# Patient Record
Sex: Male | Born: 2000
Health system: Southern US, Community
[De-identification: ages and names within clinical notes are randomized; demographics above are authoritative.]

## PROBLEM LIST (undated history)

## (undated) HISTORY — PX: TYMPANOSTOMY TUBE PLACEMENT: SHX32

## (undated) HISTORY — PX: CIRCUMCISION: SUR203

---
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
## 2022-08-18 ENCOUNTER — Encounter: Payer: Self-pay | Admitting: Family Medicine

## 2022-08-18 ENCOUNTER — Ambulatory Visit (INDEPENDENT_AMBULATORY_CARE_PROVIDER_SITE_OTHER): Payer: 59 | Admitting: Family Medicine

## 2022-08-18 ENCOUNTER — Other Ambulatory Visit (HOSPITAL_COMMUNITY): Payer: Self-pay

## 2022-08-18 VITALS — BP 148/88 | HR 108 | Ht 71.0 in | Wt 321.0 lb

## 2022-08-18 DIAGNOSIS — Z111 Encounter for screening for respiratory tuberculosis: Secondary | ICD-10-CM

## 2022-08-18 DIAGNOSIS — Z114 Encounter for screening for human immunodeficiency virus [HIV]: Secondary | ICD-10-CM

## 2022-08-18 DIAGNOSIS — Z1321 Encounter for screening for nutritional disorder: Secondary | ICD-10-CM

## 2022-08-18 DIAGNOSIS — Z6841 Body Mass Index (BMI) 40.0 and over, adult: Secondary | ICD-10-CM

## 2022-08-18 DIAGNOSIS — Z1329 Encounter for screening for other suspected endocrine disorder: Secondary | ICD-10-CM

## 2022-08-18 DIAGNOSIS — Z1159 Encounter for screening for other viral diseases: Secondary | ICD-10-CM

## 2022-08-18 DIAGNOSIS — F419 Anxiety disorder, unspecified: Secondary | ICD-10-CM | POA: Insufficient documentation

## 2022-08-18 DIAGNOSIS — Z131 Encounter for screening for diabetes mellitus: Secondary | ICD-10-CM

## 2022-08-18 DIAGNOSIS — E6609 Other obesity due to excess calories: Secondary | ICD-10-CM | POA: Insufficient documentation

## 2022-08-18 DIAGNOSIS — Z13220 Encounter for screening for lipoid disorders: Secondary | ICD-10-CM

## 2022-08-18 MED ORDER — HYDROXYZINE PAMOATE 25 MG PO CAPS
25.0000 mg | ORAL_CAPSULE | Freq: Three times a day (TID) | ORAL | 1 refills | Status: AC | PRN
Start: 2022-08-18 — End: ?
  Filled 2022-08-18 – 2022-08-19 (×2): qty 30, 10d supply, fill #0

## 2022-08-18 NOTE — Assessment & Plan Note (Signed)
Discussed lifestyle modifications follow diet low in saturated fat, reduce dietary salt intake, avoid fatty foods, maintain an exercise routine 3 to 5 days a week for a minimum total of 150 minutes.  Referral to P.R.EP

## 2022-08-18 NOTE — Addendum Note (Signed)
Addended by: DEL ORBE POLANCO, Hunner Garcon on: 08/18/2022 12:58 PM   Modules accepted: Level of Service  

## 2022-08-18 NOTE — Progress Notes (Signed)
New Patient Office Visit   Subjective   Patient ID: Ryan Mills, male    DOB: [birth year]  Age: 22 y.o. MRN: [id number]  CC:  Chief Complaint  Patient presents with   Establish Care    HPI Ryan Mills 22 year old male, presents to establish care. 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  Anxiety Presents for initial visit. Onset was 1 to 5 years ago. The problem has been waxing and waning. Symptoms include excessive worry, hyperventilation, nervous/anxious behavior, panic and shortness of breath. Patient reports no irritability or muscle tension. Symptoms occur occasionally. The symptoms are aggravated by social activities, family issues and work stress. Risk factors include prior traumatic experience and a major life event. His past medical history is significant for anxiety/panic attacks. There is no history of suicide attempts. Past treatments include lifestyle changes.     Outpatient Encounter Medications as of 08/18/2022  Medication Sig   hydrOXYzine (VISTARIL) 25 MG capsule Take 1 capsule (25 mg total) by mouth every 8 (eight) hours as needed.   albuterol (VENTOLIN HFA) 108 (90 Base) MCG/ACT inhaler Inhale 2 puffs into the lungs every 4 (four) hours as needed for wheezing or shortness of breath. (Patient not taking: Reported on 08/18/2022)   COVID-19 At Home Antigen Test (CARESTART COVID-19 HOME TEST) KIT Use as directed   EPINEPHrine 0.3 mg/0.3 mL IJ SOAJ injection Inject 0.3 mLs (0.3 mg total) into the muscle as needed for anaphylaxis. (Patient not taking: Reported on 08/18/2022)   No facility-administered encounter medications on file as of 08/18/2022.    Past Surgical History:  Procedure Laterality Date   ADENOIDECTOMY AND MYRINGOTOMY WITH TUBE PLACEMENT Bilateral 09/2005   [facility]   CIRCUMCISION     TYMPANOSTOMY TUBE PLACEMENT      Review of  Systems  Constitutional:  Negative for chills and fever.  Respiratory:  Negative for shortness of breath.   Cardiovascular:  Negative for chest pain.  Gastrointestinal:  Negative for heartburn and nausea.  Genitourinary:  Negative for dysuria.  Musculoskeletal:  Negative for myalgias.  Skin:  Negative for rash.  Neurological:  Negative for dizziness.  Psychiatric/Behavioral:  The patient is nervous/anxious.       Objective    BP (!) 148/88   Pulse (!) 108   Ht 5\' 11"  (1.803 m)   Wt (!) 321 lb (145.6 kg)   SpO2 98%   BMI 44.77 kg/m   Physical Exam Vitals reviewed.  Constitutional:      General: He is not in acute distress.    Appearance: Normal appearance. He is obese. He is not ill-appearing, toxic-appearing or diaphoretic.  Eyes:     General:        Right eye: No discharge.        Left eye: No discharge.     Conjunctiva/sclera: Conjunctivae normal.  Cardiovascular:     Rate and Rhythm: Normal rate.     Pulses: Normal pulses.     Heart sounds: Normal heart sounds.  Pulmonary:     Effort: Pulmonary effort is normal. No respiratory distress.     Breath sounds: Normal breath sounds.  Musculoskeletal:        General: Normal range of motion.     Cervical back: Normal range of motion.  Skin:    General: Skin is warm and dry.  Neurological:     General: No  focal deficit present.     Mental Status: He is alert and oriented to person, place, and time. Mental status is at baseline.  Psychiatric:        Mood and Affect: Mood normal.        Behavior: Behavior normal.       Assessment & Plan:  Anxiety Assessment & Plan:    08/18/2022   10:59 AM  GAD 7 : Generalized Anxiety Score  Nervous, Anxious, on Edge 2  Control/stop worrying 1  Worry too much - different things 2  Trouble relaxing 1  Restless 0  Easily annoyed or irritable 0  Afraid - awful might happen 1  Total GAD 7 Score 7  Anxiety Difficulty Somewhat difficult  Hydroxyzine 25 mg PRN Discussed about  cognitive behavioral therapy focusing on thoughts, belief, and attitudes that affects feelings and behavior, learning about coping skills to deal with certain problems. Maintaining a consistent routine and schedule, Practice stress management and self calming techniques, excersise regularly and spend time outdoors, Do not eat food that are high in fat, added sugar, or salt. Follow up in 8 weeks   Orders: -     hydrOXYzine Pamoate; Take 1 capsule (25 mg total) by mouth every 8 (eight) hours as needed.  Dispense: 30 capsule; Refill: 1  Screening for lipid disorders -     CBC with Differential/Platelet -     CMP14+EGFR -     Lipid panel  Screening for diabetes mellitus -     Hemoglobin A1c  Screening for thyroid disorder -     TSH + free T4  Encounter for vitamin deficiency screening -     VITAMIN D 25 Hydroxy (Vit-D Deficiency, Fractures)  Screening-pulmonary TB -     QuantiFERON-TB Gold Plus  Screening for HIV (human immunodeficiency virus) -     HIV Antibody (routine testing w rflx)  Need for hepatitis C screening test -     Hepatitis C antibody  Class 3 severe obesity due to excess calories with body mass index (BMI) of 40.0 to 44.9 in adult, unspecified whether serious comorbidity present Assessment & Plan: Discussed lifestyle modifications follow diet low in saturated fat, reduce dietary salt intake, avoid fatty foods, maintain an exercise routine 3 to 5 days a week for a minimum total of 150 minutes.  Referral to P.R.EP  Orders: -     Amb Referral To Provider Referral Exercise Program (P.R.E.P)    Return in about 8 weeks (around 10/13/2022), or blood pressure readings at home for 2 weeks if elevated make an appointment, for Anxiety.   Cruzita Lederer Newman Nip, FNP

## 2022-08-18 NOTE — Progress Notes (Deleted)
Complete physical exam  Patient: Ryan Mills   DOB: Jan 18, 2001   22 y.o. Male  MRN: [id number]  Subjective:    Chief Complaint  Patient presents with   Establish Care    Ryan Mills is a 22 y.o. male who presents today for a complete physical exam. He reports consuming a general diet. The patient does not participate in regular exercise at present. He generally feels well. He reports sleeping fairly well. He does have additional problems to discuss today.    Most recent fall risk assessment:    08/18/2022   10:58 AM  Fall Risk   Falls in the past year? 0  Number falls in past yr: 0  Injury with Fall? 0  Risk for fall due to : No Fall Risks  Follow up Falls evaluation completed     Most recent depression screenings:    08/18/2022   10:58 AM 05/09/2019   10:28 AM  PHQ 2/9 Scores  PHQ - 2 Score 3 0  PHQ- 9 Score 7 0    Vision:Within last year and Dental: No current dental problems and Receives regular dental care  Patient Care Team: Del Newman Nip, Tenna Child, FNP as PCP - General (Family Medicine)   Outpatient Medications Prior to Visit  Medication Sig   albuterol (VENTOLIN HFA) 108 (90 Base) MCG/ACT inhaler Inhale 2 puffs into the lungs every 4 (four) hours as needed for wheezing or shortness of breath. (Patient not taking: Reported on 08/18/2022)   COVID-19 At Home Antigen Test (CARESTART COVID-19 HOME TEST) KIT Use as directed   EPINEPHrine 0.3 mg/0.3 mL IJ SOAJ injection Inject 0.3 mLs (0.3 mg total) into the muscle as needed for anaphylaxis. (Patient not taking: Reported on 08/18/2022)   No facility-administered medications prior to visit.    Review of Systems  Constitutional:  Negative for chills and fever.  HENT:  Negative for ear pain and tinnitus.   Eyes:  Negative for blurred vision.  Respiratory:  Negative for cough and shortness of breath.   Cardiovascular:  Negative for chest pain.  Gastrointestinal:  Negative for abdominal pain, nausea and vomiting.   Genitourinary:  Negative for dysuria.  Musculoskeletal:  Negative for myalgias.  Skin:  Negative for rash.  Neurological:  Negative for dizziness and headaches.  Psychiatric/Behavioral:  Negative for suicidal ideas. The patient is not nervous/anxious.        Objective:    BP (!) 148/88   Pulse (!) 108   Ht 5\' 11"  (1.803 m)   Wt (!) 321 lb (145.6 kg)   SpO2 98%   BMI 44.77 kg/m  BP Readings from Last 3 Encounters:  08/18/22 (!) 148/88  05/09/19 (!) 130/83 (89 %, Z = 1.23 /  93 %, Z = 1.48)*   *BP percentiles are based on the 2017 AAP Clinical Practice Guideline for boys      Physical Exam Vitals reviewed.  Constitutional:      Appearance: Normal appearance.  HENT:     Head: Normocephalic.     Right Ear: Tympanic membrane and ear canal normal. There is no impacted cerumen.     Left Ear: Tympanic membrane and ear canal normal. There is no impacted cerumen.     Nose: Nose normal. No congestion.     Mouth/Throat:     Mouth: Mucous membranes are moist.     Pharynx: Oropharynx is clear. No oropharyngeal exudate.  Eyes:     Extraocular Movements: Extraocular movements intact.  Conjunctiva/sclera: Conjunctivae normal.  Cardiovascular:     Rate and Rhythm: Normal rate and regular rhythm.     Pulses: Normal pulses.     Heart sounds: Normal heart sounds.  Pulmonary:     Effort: Pulmonary effort is normal.     Breath sounds: Normal breath sounds.  Abdominal:     General: Bowel sounds are normal.     Palpations: Abdomen is soft. There is no mass.     Tenderness: There is no abdominal tenderness. There is no right CVA tenderness or left CVA tenderness.  Musculoskeletal:        General: Normal range of motion.     Cervical back: Normal range of motion and neck supple.     Right lower leg: No edema.     Left lower leg: No edema.  Skin:    General: Skin is warm and dry.     Capillary Refill: Capillary refill takes less than 2 seconds.     Findings: No lesion.   Neurological:     General: No focal deficit present.     Mental Status: He is alert.     Coordination: Coordination normal.     Gait: Gait normal.  Psychiatric:        Mood and Affect: Mood normal.        Behavior: Behavior normal.        Thought Content: Thought content normal.      No results found for any visits on 08/18/22.    Assessment & Plan:    Routine Health Maintenance and Physical Exam  Immunization History  Administered Date(s) Administered   Influenza,inj,Quad PF,6+ Mos 05/09/2019   Meningococcal B, OMV 05/09/2019, 06/08/2019   Meningococcal Mcv4o 05/09/2019    Health Maintenance  Topic Date Due   COVID-19 Vaccine (1) Never done   HPV VACCINES (1 - Male 2-dose series) Never done   HIV Screening  Never done   Hepatitis C Screening  Never done   DTaP/Tdap/Td (1 - Tdap) Never done   INFLUENZA VACCINE  12/09/2022    Discussed health benefits of physical activity, and encouraged him to engage in regular exercise appropriate for his age and condition.  There are no diagnoses linked to this encounter.  No follow-ups on file.     Cruzita Lederer Newman Nip, FNP

## 2022-08-18 NOTE — Patient Instructions (Signed)
It was pleasure meeting with you today. Please take medications as prescribed. Follow up with your primary health provider if any health concerns arises. If symptoms worsen please contact your primary care provider and/or visit the emergency department.  

## 2022-08-18 NOTE — Assessment & Plan Note (Signed)
    08/18/2022   10:59 AM  GAD 7 : Generalized Anxiety Score  Nervous, Anxious, on Edge 2  Control/stop worrying 1  Worry too much - different things 2  Trouble relaxing 1  Restless 0  Easily annoyed or irritable 0  Afraid - awful might happen 1  Total GAD 7 Score 7  Anxiety Difficulty Somewhat difficult  Hydroxyzine 25 mg PRN Discussed about cognitive behavioral therapy focusing on thoughts, belief, and attitudes that affects feelings and behavior, learning about coping skills to deal with certain problems. Maintaining a consistent routine and schedule, Practice stress management and self calming techniques, excersise regularly and spend time outdoors, Do not eat food that are high in fat, added sugar, or salt. Follow up in 8 weeks

## 2022-08-19 ENCOUNTER — Other Ambulatory Visit (HOSPITAL_COMMUNITY): Payer: Self-pay

## 2022-09-02 ENCOUNTER — Other Ambulatory Visit (HOSPITAL_COMMUNITY): Payer: Self-pay

## 2022-09-22 DIAGNOSIS — Z114 Encounter for screening for human immunodeficiency virus [HIV]: Secondary | ICD-10-CM | POA: Diagnosis not present

## 2022-09-22 DIAGNOSIS — Z131 Encounter for screening for diabetes mellitus: Secondary | ICD-10-CM | POA: Diagnosis not present

## 2022-09-22 DIAGNOSIS — Z1159 Encounter for screening for other viral diseases: Secondary | ICD-10-CM | POA: Diagnosis not present

## 2022-09-22 DIAGNOSIS — Z1321 Encounter for screening for nutritional disorder: Secondary | ICD-10-CM | POA: Diagnosis not present

## 2022-09-22 DIAGNOSIS — Z13220 Encounter for screening for lipoid disorders: Secondary | ICD-10-CM | POA: Diagnosis not present

## 2022-09-22 DIAGNOSIS — Z1329 Encounter for screening for other suspected endocrine disorder: Secondary | ICD-10-CM | POA: Diagnosis not present

## 2022-09-22 DIAGNOSIS — Z111 Encounter for screening for respiratory tuberculosis: Secondary | ICD-10-CM | POA: Diagnosis not present

## 2022-09-23 ENCOUNTER — Telehealth: Payer: Self-pay | Admitting: Family Medicine

## 2022-09-23 LAB — TSH+FREE T4: TSH: 3.68 u[IU]/mL (ref 0.450–4.500)

## 2022-09-23 LAB — QUANTIFERON-TB GOLD PLUS

## 2022-09-23 NOTE — Telephone Encounter (Signed)
Patient called and would like to pick up a copy of his blood work.  Call patient when ready to pick up. 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  Patient # [phone]

## 2022-09-23 NOTE — Telephone Encounter (Signed)
Spoke with patient. TB gold was ordered and drawn, still waiting on result. He said he will wait on that result to get all of his labs printed at once.

## 2022-09-24 LAB — QUANTIFERON-TB GOLD PLUS

## 2022-09-24 LAB — VITAMIN D 25 HYDROXY (VIT D DEFICIENCY, FRACTURES): Vit D, 25-Hydroxy: 10.8 ng/mL — ABNORMAL LOW (ref 30.0–100.0)

## 2022-09-26 LAB — CMP14+EGFR
ALT: 64 IU/L — ABNORMAL HIGH (ref 0–44)
AST: 29 IU/L (ref 0–40)
Albumin/Globulin Ratio: 2 (ref 1.2–2.2)
Albumin: 4.9 g/dL (ref 4.3–5.2)
Alkaline Phosphatase: 103 IU/L (ref 51–125)
BUN/Creatinine Ratio: 8 — ABNORMAL LOW (ref 9–20)
BUN: 7 mg/dL (ref 6–20)
Bilirubin Total: 0.9 mg/dL (ref 0.0–1.2)
CO2: 22 mmol/L (ref 20–29)
Calcium: 10 mg/dL (ref 8.7–10.2)
Chloride: 101 mmol/L (ref 96–106)
Creatinine, Ser: 0.83 mg/dL (ref 0.76–1.27)
Globulin, Total: 2.5 g/dL (ref 1.5–4.5)
Glucose: 93 mg/dL (ref 70–99)
Potassium: 4.2 mmol/L (ref 3.5–5.2)
Sodium: 141 mmol/L (ref 134–144)
Total Protein: 7.4 g/dL (ref 6.0–8.5)
eGFR: 129 mL/min/{1.73_m2} (ref >59)

## 2022-09-26 LAB — CBC WITH DIFFERENTIAL/PLATELET
Basophils Absolute: 0.1 10*3/uL (ref 0.0–0.2)
Basos: 1 %
EOS (ABSOLUTE): 0.1 10*3/uL (ref 0.0–0.4)
Eos: 1 %
Hematocrit: 49.2 % (ref 37.5–51.0)
Hemoglobin: 16.8 g/dL (ref 13.0–17.7)
Immature Grans (Abs): 0 10*3/uL (ref 0.0–0.1)
Immature Granulocytes: 0 %
Lymphocytes Absolute: 4 10*3/uL — ABNORMAL HIGH (ref 0.7–3.1)
Lymphs: 39 %
MCH: 31.9 pg (ref 26.6–33.0)
MCHC: 34.1 g/dL (ref 31.5–35.7)
MCV: 93 fL (ref 79–97)
Monocytes Absolute: 0.8 10*3/uL (ref 0.1–0.9)
Monocytes: 8 %
Neutrophils Absolute: 5.1 10*3/uL (ref 1.4–7.0)
Neutrophils: 51 %
Platelets: 314 10*3/uL (ref 150–450)
RBC: 5.27 x10E6/uL (ref 4.14–5.80)
RDW: 12.8 % (ref 11.6–15.4)
WBC: 10.2 10*3/uL (ref 3.4–10.8)

## 2022-09-26 LAB — HIV ANTIBODY (ROUTINE TESTING W REFLEX): HIV Screen 4th Generation wRfx: NONREACTIVE

## 2022-09-26 LAB — QUANTIFERON-TB GOLD PLUS
QuantiFERON Nil Value: 0.02 IU/mL
QuantiFERON TB1 Ag Value: 0.03 IU/mL
QuantiFERON TB2 Ag Value: 0.03 IU/mL

## 2022-09-26 LAB — HEMOGLOBIN A1C
Est. average glucose Bld gHb Est-mCnc: 97 mg/dL
Hgb A1c MFr Bld: 5 % (ref 4.8–5.6)

## 2022-09-26 LAB — TSH+FREE T4: Free T4: 1.29 ng/dL (ref 0.93–1.60)

## 2022-09-26 LAB — LIPID PANEL
Chol/HDL Ratio: 3.2 ratio (ref 0.0–5.0)
Cholesterol, Total: 145 mg/dL (ref 100–169)
HDL: 46 mg/dL (ref >39)
LDL Chol Calc (NIH): 82 mg/dL (ref 0–109)
Triglycerides: 88 mg/dL (ref 0–89)
VLDL Cholesterol Cal: 17 mg/dL (ref 5–40)

## 2022-09-26 LAB — HEPATITIS C ANTIBODY: Hep C Virus Ab: NONREACTIVE

## 2022-10-15 ENCOUNTER — Other Ambulatory Visit (HOSPITAL_COMMUNITY): Payer: Self-pay

## 2022-10-15 ENCOUNTER — Ambulatory Visit (INDEPENDENT_AMBULATORY_CARE_PROVIDER_SITE_OTHER): Payer: 59 | Admitting: Family Medicine

## 2022-10-15 ENCOUNTER — Encounter: Payer: Self-pay | Admitting: Family Medicine

## 2022-10-15 VITALS — BP 132/82 | HR 90 | Ht 70.0 in | Wt 316.0 lb

## 2022-10-15 DIAGNOSIS — R234 Changes in skin texture: Secondary | ICD-10-CM | POA: Diagnosis not present

## 2022-10-15 DIAGNOSIS — I10 Essential (primary) hypertension: Secondary | ICD-10-CM

## 2022-10-15 MED ORDER — AMLODIPINE BESYLATE 2.5 MG PO TABS
2.5000 mg | ORAL_TABLET | Freq: Every day | ORAL | 3 refills | Status: DC
Start: 2022-10-15 — End: 2022-12-10
  Filled 2022-10-15 – 2022-10-21 (×2): qty 30, 30d supply, fill #0
  Filled 2022-11-19: qty 30, 30d supply, fill #1

## 2022-10-15 NOTE — Assessment & Plan Note (Signed)
Vitals:   10/15/22 1310 10/15/22 1311  BP: (!) 138/91 132/82   At home blood pressure readings 142/88, 140/86, 141/90, 143/92 Started amlodipine 2.5 mg daily Explained non pharmacological interventions such as low salt, DASH diet discussed. Educated on stress reduction and physical activity minimum 150 minutes per week. Discussed signs and symptoms of major cardiovascular event and need to present to the ED. Follow up in 8 weeks or sooner if needed. Patient verbalizes understanding regarding plan of care and all questions answered.

## 2022-10-15 NOTE — Patient Instructions (Signed)
        Great to see you today.   - Please take medications as prescribed. - Follow up with your primary health provider if any health concerns arises. - If symptoms worsen please contact your primary care provider and/or visit the emergency department.  

## 2022-10-15 NOTE — Progress Notes (Signed)
Patient Office Visit   Subjective   Patient ID: Ryan Mills, male    DOB: Mar 29, 2001  Age: 22 y.o. MRN: [id number]  CC:  Chief Complaint  Patient presents with   Hypertension    Patient is here for f/u of HTN. No changes or concerns since last visit.     HPI HEWITT GARNER 22 year old male, presents to the clinic for HTN follow up. He  has a past medical history of Acne (02/2016), Allergy to bee sting (12/2012), Mild intermittent asthma without complication (02/2016), and Obesity (02/2016).For the details of today's visit, please refer to assessment and plan.   HPI    Outpatient Encounter Medications as of 10/15/2022  Medication Sig   amLODipine (NORVASC) 2.5 MG tablet Take 1 tablet (2.5 mg total) by mouth daily.   hydrOXYzine (VISTARIL) 25 MG capsule Take 1 capsule (25 mg total) by mouth every 8 (eight) hours as needed.   albuterol (VENTOLIN HFA) 108 (90 Base) MCG/ACT inhaler Inhale 2 puffs into the lungs every 4 (four) hours as needed for wheezing or shortness of breath. (Patient not taking: Reported on 08/18/2022)   COVID-19 At Home Antigen Test [facility] COVID-19 HOME TEST) KIT Use as directed (Patient not taking: Reported on 10/15/2022)   EPINEPHrine 0.3 mg/0.3 mL IJ SOAJ injection Inject 0.3 mLs (0.3 mg total) into the muscle as needed for anaphylaxis. (Patient not taking: Reported on 08/18/2022)   No facility-administered encounter medications on file as of 10/15/2022.    Past Surgical History:  Procedure Laterality Date   ADENOIDECTOMY AND MYRINGOTOMY WITH TUBE PLACEMENT Bilateral 09/2005   [facility]   CIRCUMCISION     TYMPANOSTOMY TUBE PLACEMENT      Review of Systems  Constitutional:  Negative for chills and fever.  Respiratory:  Negative for shortness of breath.   Cardiovascular:  Negative for chest pain.  Gastrointestinal:  Negative for abdominal pain.  Genitourinary:  Negative for dysuria.  Musculoskeletal:  Negative for myalgias.  Neurological:  Negative  for dizziness.      Objective    BP 132/82   Pulse 90   Ht 5\' 10"  (1.778 m)   Wt (!) 316 lb (143.3 kg)   SpO2 98%   BMI 45.34 kg/m   Physical Exam Vitals reviewed.  Constitutional:      General: He is not in acute distress.    Appearance: Normal appearance. He is not ill-appearing, toxic-appearing or diaphoretic.  HENT:     Head: Normocephalic.  Eyes:     General:        Right eye: No discharge.        Left eye: No discharge.     Conjunctiva/sclera: Conjunctivae normal.  Cardiovascular:     Rate and Rhythm: Normal rate.     Pulses: Normal pulses.     Heart sounds: Normal heart sounds.  Pulmonary:     Effort: Pulmonary effort is normal. No respiratory distress.     Breath sounds: Normal breath sounds.  Musculoskeletal:        General: Normal range of motion.     Cervical back: Normal range of motion.  Skin:    General: Skin is warm and dry.     Capillary Refill: Capillary refill takes less than 2 seconds.  Neurological:     General: No focal deficit present.     Mental Status: He is alert and oriented to person, place, and time.     Coordination: Coordination normal.  Gait: Gait normal.  Psychiatric:        Mood and Affect: Mood normal.       Assessment & Plan:  Primary hypertension Assessment & Plan: Vitals:   10/15/22 1310 10/15/22 1311  BP: (!) 138/91 132/82   At home blood pressure readings 142/88, 140/86, 141/90, 143/92 Started amlodipine 2.5 mg daily Explained non pharmacological interventions such as low salt, DASH diet discussed. Educated on stress reduction and physical activity minimum 150 minutes per week. Discussed signs and symptoms of major cardiovascular event and need to present to the ED. Follow up in 8 weeks or sooner if needed. Patient verbalizes understanding regarding plan of care and all questions answered.   Orders: -     amLODIPine Besylate; Take 1 tablet (2.5 mg total) by mouth daily.  Dispense: 30 tablet; Refill: 3  Skin  texture changes -     Ambulatory referral to Dermatology    Return in about 8 weeks (around 12/10/2022) for hypertension, re-check blood pressure.   Cruzita Lederer Newman Nip, FNP

## 2022-10-21 ENCOUNTER — Other Ambulatory Visit (HOSPITAL_COMMUNITY): Payer: Self-pay

## 2022-11-19 ENCOUNTER — Other Ambulatory Visit (HOSPITAL_COMMUNITY): Payer: Self-pay

## 2022-12-10 ENCOUNTER — Other Ambulatory Visit (HOSPITAL_COMMUNITY): Payer: Self-pay

## 2022-12-10 ENCOUNTER — Ambulatory Visit (INDEPENDENT_AMBULATORY_CARE_PROVIDER_SITE_OTHER): Payer: 59 | Admitting: Family Medicine

## 2022-12-10 ENCOUNTER — Encounter: Payer: Self-pay | Admitting: Family Medicine

## 2022-12-10 VITALS — BP 142/88 | HR 89 | Ht 70.0 in | Wt 311.1 lb

## 2022-12-10 DIAGNOSIS — L819 Disorder of pigmentation, unspecified: Secondary | ICD-10-CM

## 2022-12-10 DIAGNOSIS — I10 Essential (primary) hypertension: Secondary | ICD-10-CM | POA: Diagnosis not present

## 2022-12-10 MED ORDER — AMLODIPINE BESYLATE 5 MG PO TABS
5.0000 mg | ORAL_TABLET | Freq: Every day | ORAL | 1 refills | Status: DC
  Filled 2022-12-10 – 2022-12-24 (×2): qty 90, 90d supply, fill #0

## 2022-12-10 NOTE — Progress Notes (Signed)
Patient Office Visit   Subjective   Patient ID: Ryan Mills, male    DOB: 2000/05/12  Age: 22 y.o. MRN: [id number]  CC:  Chief Complaint  Patient presents with   Care Management    8 week f/u, following up today for htn.     HPI QUOC TOME 22 year old male, presents to the clinic for HTN follow up. He  has a past medical history of Acne (02/2016), Allergy to bee sting (12/2012), Mild intermittent asthma without complication (02/2016), and Obesity (02/2016).For the details of today's visit, please refer to assessment and plan.   HPI    Outpatient Encounter Medications as of 12/10/2022  Medication Sig   amLODipine (NORVASC) 5 MG tablet Take 1 tablet (5 mg total) by mouth daily.   EPINEPHrine 0.3 mg/0.3 mL IJ SOAJ injection Inject 0.3 mLs (0.3 mg total) into the muscle as needed for anaphylaxis.   hydrOXYzine (VISTARIL) 25 MG capsule Take 1 capsule (25 mg total) by mouth every 8 (eight) hours as needed.   [DISCONTINUED] amLODipine (NORVASC) 2.5 MG tablet Take 1 tablet (2.5 mg total) by mouth daily.   [DISCONTINUED] COVID-19 At Home Antigen Test ([facility] COVID-19 HOME TEST) KIT Use as directed   albuterol (VENTOLIN HFA) 108 (90 Base) MCG/ACT inhaler Inhale 2 puffs into the lungs every 4 (four) hours as needed for wheezing or shortness of breath. (Patient not taking: Reported on 12/10/2022)   No facility-administered encounter medications on file as of 12/10/2022.    Past Surgical History:  Procedure Laterality Date   ADENOIDECTOMY AND MYRINGOTOMY WITH TUBE PLACEMENT Bilateral 09/2005   [facility]   CIRCUMCISION     TYMPANOSTOMY TUBE PLACEMENT      Review of Systems  Constitutional:  Negative for fever.  Eyes:  Negative for blurred vision.  Respiratory:  Negative for shortness of breath.   Cardiovascular:  Negative for chest pain.  Neurological:  Negative for dizziness.      Objective    BP (!) 142/88 (BP Location: Left Arm)   Pulse 89   Ht 5\' 10"  (1.778 m)   Wt  (!) 311 lb 1.9 oz (141.1 kg)   SpO2 98%   BMI 44.64 kg/m   Physical Exam Vitals reviewed.  Constitutional:      General: He is not in acute distress.    Appearance: Normal appearance. He is not ill-appearing, toxic-appearing or diaphoretic.  HENT:     Head: Normocephalic.  Eyes:     General:        Right eye: No discharge.        Left eye: No discharge.     Conjunctiva/sclera: Conjunctivae normal.  Cardiovascular:     Rate and Rhythm: Normal rate.     Pulses: Normal pulses.     Heart sounds: Normal heart sounds.  Pulmonary:     Effort: Pulmonary effort is normal. No respiratory distress.     Breath sounds: Normal breath sounds.  Musculoskeletal:        General: Normal range of motion.     Cervical back: Normal range of motion.  Skin:    General: Skin is warm and dry.     Capillary Refill: Capillary refill takes less than 2 seconds.  Neurological:     General: No focal deficit present.     Mental Status: He is alert and oriented to person, place, and time.     Coordination: Coordination normal.     Gait: Gait normal.  Psychiatric:  Mood and Affect: Mood normal.        Behavior: Behavior normal.       Assessment & Plan:  Hyperpigmentation -     Ambulatory referral to Dermatology  Primary hypertension Assessment & Plan: Vitals:   12/10/22 1303 12/10/22 1309  BP: (!) 157/91 (!) 142/88  Increased Amlodipine 5 mg once daily Continued discussion on DASH diet, low sodium diet and maintain a exercise routine for 150 minutes per week.    Other orders -     amLODIPine Besylate; Take 1 tablet (5 mg total) by mouth daily.  Dispense: 90 tablet; Refill: 1    Return in about 3 months (around 03/12/2023), or if symptoms worsen or fail to improve, for hypertension.   Cruzita Lederer Newman Nip, FNP

## 2022-12-10 NOTE — Assessment & Plan Note (Signed)
Vitals:   12/10/22 1303 12/10/22 1309  BP: (!) 157/91 (!) 142/88  Increased Amlodipine 5 mg once daily Continued discussion on DASH diet, low sodium diet and maintain a exercise routine for 150 minutes per week.

## 2022-12-10 NOTE — Patient Instructions (Signed)

## 2022-12-20 ENCOUNTER — Other Ambulatory Visit (HOSPITAL_COMMUNITY): Payer: Self-pay

## 2022-12-24 ENCOUNTER — Other Ambulatory Visit (HOSPITAL_COMMUNITY): Payer: Self-pay

## 2022-12-24 ENCOUNTER — Other Ambulatory Visit: Payer: Self-pay

## 2023-03-13 NOTE — Progress Notes (Unsigned)
   Established Patient Office Visit   Subjective  Patient ID: Ryan Mills, male    DOB: [birth year]  Age: 22 y.o. MRN: [id number]  No chief complaint on file.   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  HPI  ROS    Objective:     There were no vitals taken for this visit. {Vitals History (Optional):23777}  Physical Exam   No results found for any visits on 03/14/23.  The ASCVD Risk score (Arnett DK, et al., 2019) failed to calculate for the following reasons:   The 2019 ASCVD risk score is only valid for ages 103 to 10    Assessment & Plan:  There are no diagnoses linked to this encounter.  No follow-ups on file.   Cruzita Lederer Newman Nip, FNP

## 2023-03-13 NOTE — Patient Instructions (Signed)

## 2023-03-14 ENCOUNTER — Other Ambulatory Visit (HOSPITAL_COMMUNITY): Payer: Self-pay

## 2023-03-14 ENCOUNTER — Encounter: Payer: Self-pay | Admitting: Family Medicine

## 2023-03-14 ENCOUNTER — Ambulatory Visit (INDEPENDENT_AMBULATORY_CARE_PROVIDER_SITE_OTHER): Payer: 59 | Admitting: Family Medicine

## 2023-03-14 VITALS — BP 130/78 | HR 103 | Ht 70.0 in | Wt 311.0 lb

## 2023-03-14 DIAGNOSIS — Z23 Encounter for immunization: Secondary | ICD-10-CM

## 2023-03-14 DIAGNOSIS — I10 Essential (primary) hypertension: Secondary | ICD-10-CM | POA: Diagnosis not present

## 2023-03-14 MED ORDER — AMLODIPINE BESYLATE 5 MG PO TABS
5.0000 mg | ORAL_TABLET | Freq: Every day | ORAL | 1 refills | Status: DC
  Filled 2023-03-14 – 2023-03-21 (×2): qty 90, 90d supply, fill #0
  Filled 2023-07-02: qty 90, 90d supply, fill #1

## 2023-03-14 NOTE — Assessment & Plan Note (Signed)
Amlodipine 5 mg once daily Discussed with  patient to monitor their blood pressure regularly and maintain a heart-healthy diet rich in fruits, vegetables, whole grains, and low-fat dairy, while reducing sodium intake to less than 2,300 mg per day. Regular physical activity, such as 30 minutes of moderate exercise most days of the week, will help lower blood pressure and improve overall cardiovascular health. Avoiding smoking, limiting alcohol consumption, and managing stress. Take  prescribed medication, & take it as directed and avoid skipping doses. Seek emergency care if your blood pressure is (over 180/100) or you experience chest pain, shortness of breath, or sudden vision changes.Patient verbalizes understanding regarding plan of care and all questions answered.

## 2023-03-21 ENCOUNTER — Other Ambulatory Visit (HOSPITAL_COMMUNITY): Payer: Self-pay

## 2023-07-03 ENCOUNTER — Other Ambulatory Visit (HOSPITAL_COMMUNITY): Payer: Self-pay

## 2023-07-25 ENCOUNTER — Encounter: Payer: Self-pay | Admitting: Dermatology

## 2023-07-25 ENCOUNTER — Ambulatory Visit (INDEPENDENT_AMBULATORY_CARE_PROVIDER_SITE_OTHER): Payer: 59 | Admitting: Dermatology

## 2023-07-25 DIAGNOSIS — L83 Acanthosis nigricans: Secondary | ICD-10-CM

## 2023-07-25 NOTE — Progress Notes (Signed)
   New Patient Visit   Subjective  Ryan Mills is a 23 y.o. male who presents for the following: Discoloration  Patient states he  has Discoloration located at the arms and posterior Neck that he  would like to have examined. Patient reports the areas have been there for 2 years. He reports the areas are not bothersome.He states that the areas have not spread. Patient reports he  has not previously been treated for these areas.  The following portions of the chart were reviewed this encounter and updated as appropriate: medications, allergies, medical history  Review of Systems:  No other skin or systemic complaints except as noted in HPI or Assessment and Plan.  Objective  Well appearing patient in no apparent distress; mood and affect are within normal limits.  A focused examination was performed of the following areas: B/L antecubital fossas and posterior neck  Relevant exam findings are noted in the Assessment and Plan.                 Assessment & Plan   Acanthosis Nigricans Exam: Thickened, velvety, and darkened patches of skin  - Assessment: Light brown velvety plaques on posterior neck and bilateral antecubital facies, consistent with classic acanthosis nigricans. Patient's hemoglobin A1c is 5.0, below the prediabetic threshold of 5.7. Acanthosis nigricans noted as a warning sign of insulin resistance and potential future blood sugar elevation. No family history of diabetes reported.   - Plan:    Referral to [facility] for comprehensive weight management    Topical treatment regimen:     - The Ordinary glycolic acid toner: Apply at night, twice weekly for one month, then every other night     - La Roche-Posay moisturizer with urea: Apply every morning     - Eucerin Radiant Tone serum with thiamidol: Apply morning and night, except on nights when using glycolic acid toner    Follow-up appointment in 6 months to assess  improvement    Photographic documentation for baseline comparison  Follow-up in 6 months for reassessment and evaluation of treatment efficacy.    No follow-ups on file.  Documentation: I have reviewed the above documentation for accuracy and completeness, and I agree with the above.  Stasia Cavalier, am acting as scribe for Langston Reusing, DO.  Langston Reusing, DO

## 2023-07-25 NOTE — Patient Instructions (Addendum)
 Hello Mr. Skalla,  Thank you for visiting my office today. I appreciate your commitment to improving your health and addressing your concerns about the dark patches on your skin.  Here is a summary of the key instructions and recommendations from our consultation:  - Referral: I recommend a referral to [place] for a comprehensive plan that may include medications like semaglutide.  - Skin Care Regimen:   - Glycolic Acid Toner by The Ordinary: Use at night, two nights a week for the next month, then every other night.   - Moisturizer with Urea by La Roche-Posay: Apply every morning.   - Radiant Tone Serum by Eucerin with Thiamidol: Apply every morning and night, except on nights when using the glycolic acid toner.  - Follow-Up: Schedule a follow-up appointment in six months to assess progress. If there is no improvement, we will reassess the treatment plan.  Please ensure consistency in applying these treatments and making the recommended lifestyle changes. I have provided pictures and notes for your reference to help you find these products over the counter. If you have any questions or issues, feel free to contact us via MyChart.  We look forward to seeing the positive changes in your next visit. If you have any questions or concerns before then, please do not hesitate to contact our office.  Warm regards,  Dr. Langston Reusing, Dermatology        Important Information   Due to recent changes in healthcare laws, you may see results of your pathology and/or laboratory studies on MyChart before the doctors have had a chance to review them. We understand that in some cases there may be results that are confusing or concerning to you. Please understand that not all results are received at the same time and often the doctors may need to interpret multiple results in order to provide you with the best plan of care or course of treatment. Therefore, we  ask that you please give [place] 2 business days to thoroughly review all your results before contacting the office for clarification. Should we see a critical lab result, you will be contacted sooner.     If You Need Anything After Your Visit   If you have any questions or concerns for your doctor, please call our main line at [phone]. If no one answers, please leave a voicemail as directed and we will return your call as soon as possible. Messages left after 4 pm will be answered the following business day.    You may also send [place] a message via MyChart. We typically respond to MyChart messages within 1-2 business days.  For prescription refills, please ask your pharmacy to contact our office. Our fax number is [phone].  If you have an urgent issue when the clinic is closed that cannot wait until the next business day, you can page your doctor at the number below.     Please note that while we do our best to be available for urgent issues outside of office hours, we are not available 24/7.    If you have an urgent issue and are unable to reach [place], you may choose to seek medical care at your doctor's office, retail clinic, urgent care center, or emergency room.   If you have a medical emergency, please immediately call 911 or go to the emergency department. In the event of inclement weather, please call our main line at [phone] for an update on the status  of any delays or closures.  Dermatology Medication Tips: Please keep the boxes that topical medications come in in order to help keep track of the instructions about where and how to use these. Pharmacies typically print the medication instructions only on the boxes and not directly on the medication tubes.   If your medication is too expensive, please contact our office at [phone] or send [place] a message through MyChart.    We are unable to tell what your co-pay for medications will be in advance as this is different depending  on your insurance coverage. However, we may be able to find a substitute medication at lower cost or fill out paperwork to get insurance to cover a needed medication.    If a prior authorization is required to get your medication covered by your insurance company, please allow [place] 1-2 business days to complete this process.   Drug prices often vary depending on where the prescription is filled and some pharmacies may offer cheaper prices.   The website www.goodrx.com contains coupons for medications through different pharmacies. The prices here do not account for what the cost may be with help from insurance (it may be cheaper with your insurance), but the website can give you the price if you did not use any insurance.  - You can print the associated coupon and take it with your prescription to the pharmacy.  - You may also stop by our office during regular business hours and pick up a GoodRx coupon card.  - If you need your prescription sent electronically to a different pharmacy, notify our office through [facility] or by phone at [phone]

## 2023-09-15 ENCOUNTER — Ambulatory Visit: Payer: 59 | Admitting: Family Medicine

## 2023-09-25 ENCOUNTER — Other Ambulatory Visit: Payer: Self-pay | Admitting: Family Medicine

## 2023-09-25 DIAGNOSIS — I10 Essential (primary) hypertension: Secondary | ICD-10-CM

## 2023-09-26 ENCOUNTER — Other Ambulatory Visit: Payer: Self-pay

## 2023-09-26 ENCOUNTER — Other Ambulatory Visit (HOSPITAL_COMMUNITY): Payer: Self-pay

## 2023-09-26 MED ORDER — AMLODIPINE BESYLATE 5 MG PO TABS
5.0000 mg | ORAL_TABLET | Freq: Every day | ORAL | 1 refills | Status: DC
  Filled 2023-09-26: qty 90, 90d supply, fill #0
  Filled 2024-01-15: qty 90, 90d supply, fill #1

## 2023-10-14 ENCOUNTER — Ambulatory Visit: Admitting: Family Medicine

## 2023-10-14 ENCOUNTER — Other Ambulatory Visit: Payer: Self-pay

## 2023-10-14 ENCOUNTER — Other Ambulatory Visit (HOSPITAL_COMMUNITY): Payer: Self-pay

## 2023-10-14 VITALS — BP 145/85 | HR 109 | Ht 70.0 in | Wt 300.0 lb

## 2023-10-14 DIAGNOSIS — E038 Other specified hypothyroidism: Secondary | ICD-10-CM | POA: Diagnosis not present

## 2023-10-14 DIAGNOSIS — E559 Vitamin D deficiency, unspecified: Secondary | ICD-10-CM | POA: Diagnosis not present

## 2023-10-14 DIAGNOSIS — I10 Essential (primary) hypertension: Secondary | ICD-10-CM

## 2023-10-14 MED ORDER — AMLODIPINE BESYLATE 2.5 MG PO TABS
2.5000 mg | ORAL_TABLET | Freq: Every day | ORAL | 2 refills | Status: DC
  Filled 2023-10-14 (×2): qty 30, 30d supply, fill #0
  Filled 2023-11-12: qty 30, 30d supply, fill #1
  Filled 2023-12-19: qty 30, 30d supply, fill #2

## 2023-10-14 NOTE — Progress Notes (Signed)
 Established Patient Office Visit   Subjective  Patient ID: Ryan Mills, male    DOB: 2000/06/25  Age: 23 y.o. MRN: [id number]  Chief Complaint  Patient presents with   Medical Management of Chronic Issues    6 month follow up    He  has a past medical history of Acne (02/2016), Allergy to bee sting (12/2012), Mild intermittent asthma without complication (02/2016), and Obesity (02/2016).  HPI Patient presents to the clinic for hypertension follow up. For the details of today's visit, please refer to assessment and plan.   Review of Systems  Constitutional:  Negative for chills and fever.  Respiratory:  Negative for shortness of breath.   Cardiovascular:  Negative for chest pain.  Neurological:  Negative for dizziness and headaches.      Objective:     BP (!) 145/85   Pulse (!) 109   Ht 5\' 10"  (1.778 m)   Wt 300 lb (136.1 kg)   SpO2 97%   BMI 43.05 kg/m  BP Readings from Last 3 Encounters:  10/14/23 (!) 145/85  03/14/23 130/78  12/10/22 (!) 142/88      Physical Exam Vitals reviewed.  Constitutional:      General: He is not in acute distress.    Appearance: Normal appearance. He is not ill-appearing, toxic-appearing or diaphoretic.  HENT:     Head: Normocephalic.  Eyes:     General:        Right eye: No discharge.        Left eye: No discharge.     Conjunctiva/sclera: Conjunctivae normal.  Cardiovascular:     Rate and Rhythm: Normal rate.     Pulses: Normal pulses.     Heart sounds: Normal heart sounds.  Pulmonary:     Effort: Pulmonary effort is normal. No respiratory distress.     Breath sounds: Normal breath sounds.  Skin:    General: Skin is warm and dry.     Capillary Refill: Capillary refill takes less than 2 seconds.  Neurological:     Mental Status: He is alert.     Coordination: Coordination normal.     Gait: Gait normal.  Psychiatric:        Mood and Affect: Mood normal.        Behavior: Behavior normal.      No results found for  any visits on 10/14/23.  The ASCVD Risk score (Arnett DK, et al., 2019) failed to calculate for the following reasons:   The 2019 ASCVD risk score is only valid for ages 66 to 75    Assessment & Plan:  Primary hypertension Assessment & Plan: Vitals:   10/14/23 1341 10/14/23 1342  BP: (!) 143/84 (!) 145/85  Elevated blood pressure noted. Continue Amlodipine  5 mg once daily and augment by adding an additional 2.5 mg for a total daily dose of 7.5 mg Follow up in 4   weeks with at home blood pressure to monitor trends [facility] ordered. Discussed with  patient to monitor their blood pressure regularly and maintain a heart-healthy diet rich in fruits, vegetables, whole grains, and low-fat dairy, while reducing sodium intake to less than 2,300 mg per day. Regular physical activity, such as 30 minutes of moderate exercise most days of the week, will help lower blood pressure and improve overall cardiovascular health. Avoiding smoking, limiting alcohol consumption, and managing stress. Take  prescribed medication, & take it as directed and avoid skipping doses. Seek emergency care if your blood  pressure is (over 180/100) or you experience chest pain, shortness of breath, or sudden vision changes.Patient verbalizes understanding regarding plan of care and all questions answered.   Orders: -     BMP8+eGFR -     CBC with Differential/Platelet -     Hemoglobin A1c  TSH (thyroid-stimulating hormone deficiency) -     TSH + free T4  Vitamin D  deficiency -     VITAMIN D  25 Hydroxy (Vit-D Deficiency, Fractures)  Other orders -     amLODIPine  Besylate; Take 1 tablet (2.5 mg total) by mouth daily.  Dispense: 30 tablet; Refill: 2    Return in about 6 months (around 04/14/2024), or if symptoms worsen or fail to improve, for hypertension.   Avelino Lek Amber Bail, FNP

## 2023-10-14 NOTE — Patient Instructions (Signed)

## 2023-10-14 NOTE — Assessment & Plan Note (Signed)
 Vitals:   10/14/23 1341 10/14/23 1342  BP: (!) 143/84 (!) 145/85  Elevated blood pressure noted. Continue Amlodipine  5 mg once daily and augment by adding an additional 2.5 mg for a total daily dose of 7.5 mg Follow up in 4   weeks with at home blood pressure to monitor trends VIA [facility] ordered. Discussed with  patient to monitor their blood pressure regularly and maintain a heart-healthy diet rich in fruits, vegetables, whole grains, and low-fat dairy, while reducing sodium intake to less than 2,300 mg per day. Regular physical activity, such as 30 minutes of moderate exercise most days of the week, will help lower blood pressure and improve overall cardiovascular health. Avoiding smoking, limiting alcohol consumption, and managing stress. Take  prescribed medication, & take it as directed and avoid skipping doses. Seek emergency care if your blood pressure is (over 180/100) or you experience chest pain, shortness of breath, or sudden vision changes.Patient verbalizes understanding regarding plan of care and all questions answered.

## 2023-10-16 LAB — BMP8+EGFR
BUN/Creatinine Ratio: 9 (ref 9–20)
BUN: 8 mg/dL (ref 6–20)
CO2: 21 mmol/L (ref 20–29)
Calcium: 9.8 mg/dL (ref 8.7–10.2)
Chloride: 102 mmol/L (ref 96–106)
Creatinine, Ser: 0.89 mg/dL (ref 0.76–1.27)
Glucose: 94 mg/dL (ref 70–99)
Potassium: 4.5 mmol/L (ref 3.5–5.2)
Sodium: 140 mmol/L (ref 134–144)
eGFR: 126 mL/min/{1.73_m2} (ref >59)

## 2023-10-16 LAB — CBC WITH DIFFERENTIAL/PLATELET
Basophils Absolute: 0.1 10*3/uL (ref 0.0–0.2)
Basos: 1 %
EOS (ABSOLUTE): 0.2 10*3/uL (ref 0.0–0.4)
Eos: 3 %
Hematocrit: 47.9 % (ref 37.5–51.0)
Hemoglobin: 16.4 g/dL (ref 13.0–17.7)
Immature Grans (Abs): 0 10*3/uL (ref 0.0–0.1)
Immature Granulocytes: 0 %
Lymphocytes Absolute: 2.8 10*3/uL (ref 0.7–3.1)
Lymphs: 35 %
MCH: 32.7 pg (ref 26.6–33.0)
MCHC: 34.2 g/dL (ref 31.5–35.7)
MCV: 95 fL (ref 79–97)
Monocytes Absolute: 0.6 10*3/uL (ref 0.1–0.9)
Monocytes: 8 %
Neutrophils Absolute: 4.1 10*3/uL (ref 1.4–7.0)
Neutrophils: 53 %
Platelets: 283 10*3/uL (ref 150–450)
RBC: 5.02 x10E6/uL (ref 4.14–5.80)
RDW: 12.8 % (ref 11.6–15.4)
WBC: 7.8 10*3/uL (ref 3.4–10.8)

## 2023-10-16 LAB — HEMOGLOBIN A1C
Est. average glucose Bld gHb Est-mCnc: 97 mg/dL
Hgb A1c MFr Bld: 5 % (ref 4.8–5.6)

## 2023-10-16 LAB — VITAMIN D 25 HYDROXY (VIT D DEFICIENCY, FRACTURES): Vit D, 25-Hydroxy: 31.7 ng/mL (ref 30.0–100.0)

## 2023-10-16 LAB — TSH+FREE T4
Free T4: 1.18 ng/dL (ref 0.82–1.77)
TSH: 1.57 u[IU]/mL (ref 0.450–4.500)

## 2023-10-18 ENCOUNTER — Ambulatory Visit: Payer: Self-pay | Admitting: Family Medicine

## 2023-11-14 ENCOUNTER — Other Ambulatory Visit (HOSPITAL_COMMUNITY): Payer: Self-pay

## 2023-12-19 ENCOUNTER — Other Ambulatory Visit (HOSPITAL_BASED_OUTPATIENT_CLINIC_OR_DEPARTMENT_OTHER): Payer: Self-pay

## 2024-01-15 ENCOUNTER — Other Ambulatory Visit: Payer: Self-pay | Admitting: Family Medicine

## 2024-01-16 ENCOUNTER — Other Ambulatory Visit: Payer: Self-pay

## 2024-01-16 ENCOUNTER — Other Ambulatory Visit (HOSPITAL_COMMUNITY): Payer: Self-pay

## 2024-01-16 MED ORDER — AMLODIPINE BESYLATE 2.5 MG PO TABS
2.5000 mg | ORAL_TABLET | Freq: Every day | ORAL | 2 refills | Status: DC
  Filled 2024-01-16: qty 30, 30d supply, fill #0
  Filled 2024-02-12: qty 30, 30d supply, fill #1
  Filled 2024-03-13: qty 30, 30d supply, fill #2

## 2024-01-24 ENCOUNTER — Ambulatory Visit: Admitting: Dermatology

## 2024-02-13 ENCOUNTER — Other Ambulatory Visit (HOSPITAL_COMMUNITY): Payer: Self-pay

## 2024-03-13 ENCOUNTER — Other Ambulatory Visit (HOSPITAL_COMMUNITY): Payer: Self-pay

## 2024-04-08 ENCOUNTER — Other Ambulatory Visit: Payer: Self-pay | Admitting: Family Medicine

## 2024-04-08 DIAGNOSIS — I10 Essential (primary) hypertension: Secondary | ICD-10-CM

## 2024-04-09 ENCOUNTER — Other Ambulatory Visit: Payer: Self-pay

## 2024-04-09 ENCOUNTER — Other Ambulatory Visit (HOSPITAL_BASED_OUTPATIENT_CLINIC_OR_DEPARTMENT_OTHER): Payer: Self-pay

## 2024-04-09 MED ORDER — AMLODIPINE BESYLATE 2.5 MG PO TABS
2.5000 mg | ORAL_TABLET | Freq: Every day | ORAL | 2 refills | Status: AC
  Filled 2024-04-09: qty 30, 30d supply, fill #0
  Filled 2024-05-28: qty 30, 30d supply, fill #1

## 2024-04-09 MED ORDER — AMLODIPINE BESYLATE 5 MG PO TABS
5.0000 mg | ORAL_TABLET | Freq: Every day | ORAL | 1 refills | Status: AC
  Filled 2024-04-09: qty 90, 90d supply, fill #0

## 2024-04-16 ENCOUNTER — Telehealth: Admitting: Family Medicine

## 2024-04-16 DIAGNOSIS — R7301 Impaired fasting glucose: Secondary | ICD-10-CM

## 2024-04-16 DIAGNOSIS — E569 Vitamin deficiency, unspecified: Secondary | ICD-10-CM

## 2024-04-16 DIAGNOSIS — E7849 Other hyperlipidemia: Secondary | ICD-10-CM

## 2024-04-16 DIAGNOSIS — E038 Other specified hypothyroidism: Secondary | ICD-10-CM | POA: Diagnosis not present

## 2024-04-16 NOTE — Progress Notes (Signed)
   Virtual Visit via Video Note  I connected with Ryan Mills on 04/16/24 at  1:40 PM EST by a video enabled telemedicine application and verified that I am speaking with the correct person using two identifiers.  Patient Location: Home Provider Location: Office/Clinic  I discussed the limitations, risks, security, and privacy concerns of performing an evaluation and management service by video and the availability of in person appointments. I also discussed with the patient that there may be a patient responsible charge related to this service. The patient expressed understanding and agreed to proceed.  Subjective: PCP: Terry Wilhelmena Lloyd Hilario, FNP  Chief Complaint  Patient presents with   Hypertension    Six month follow up    HPI The patient was seen today virtually for follow-up of his chronic conditions and reports no complaints or concerns. He notes adherence and compliance with his current treatment regimen.   ROS: Per HPI  Current Outpatient Medications:    albuterol  (VENTOLIN  HFA) 108 (90 Base) MCG/ACT inhaler, Inhale 2 puffs into the lungs every 4 (four) hours as needed for wheezing or shortness of breath. (Patient not taking: Reported on 12/10/2022), Disp: 13.4 g, Rfl: 0   amLODipine  (NORVASC ) 2.5 MG tablet, Take 1 tablet (2.5 mg total) by mouth daily., Disp: 30 tablet, Rfl: 2   amLODipine  (NORVASC ) 5 MG tablet, Take 1 tablet (5 mg total) by mouth daily., Disp: 90 tablet, Rfl: 1   EPINEPHrine  0.3 mg/0.3 mL IJ SOAJ injection, Inject 0.3 mLs (0.3 mg total) into the muscle as needed for anaphylaxis. (Patient not taking: Reported on 10/14/2023), Disp: 2 each, Rfl: 1   hydrOXYzine  (VISTARIL ) 25 MG capsule, Take 1 capsule (25 mg total) by mouth every 8 (eight) hours as needed. (Patient not taking: Reported on 10/14/2023), Disp: 30 capsule, Rfl: 1  Observations/Objective: There were no vitals filed for this visit. Physical Exam Patient is well-developed, well-nourished in no acute  distress.  Resting comfortably at home.  Head is normocephalic, atraumatic.  No labored breathing.  Speech is clear and coherent with logical content.  Patient is alert and oriented at baseline.   Assessment and Plan: Other hyperlipidemia -     Lipid panel -     CMP14+EGFR -     CBC with Differential/Platelet  IFG (impaired fasting glucose) -     Hemoglobin A1c  Vitamin deficiency -     VITAMIN D  25 Hydroxy (Vit-D Deficiency, Fractures)  TSH (thyroid -stimulating hormone deficiency) -     TSH + free T4  Encouraged to continue treatment regimen as is pending labs  Note: This chart has been completed using [employer] (consulting) software, and while attempts have been made to ensure accuracy, certain words and phrases may not be transcribed as intended.    Follow Up Instructions: Return in about 6 months (around 10/15/2024).   I discussed the assessment and treatment plan with the patient. The patient was provided an opportunity to ask questions, and all were answered. The patient agreed with the plan and demonstrated an understanding of the instructions.   The patient was advised to call back or seek an in-person evaluation if the symptoms worsen or if the condition fails to improve as anticipated.  The above assessment and management plan was discussed with the patient. The patient verbalized understanding of and has agreed to the management plan.   Ryan Mills  Z Bacchus, FNP

## 2024-04-17 DIAGNOSIS — E7849 Other hyperlipidemia: Secondary | ICD-10-CM | POA: Diagnosis not present

## 2024-04-17 DIAGNOSIS — E038 Other specified hypothyroidism: Secondary | ICD-10-CM | POA: Diagnosis not present

## 2024-04-17 DIAGNOSIS — R7301 Impaired fasting glucose: Secondary | ICD-10-CM | POA: Diagnosis not present

## 2024-04-17 DIAGNOSIS — E569 Vitamin deficiency, unspecified: Secondary | ICD-10-CM | POA: Diagnosis not present

## 2024-04-18 LAB — HEMOGLOBIN A1C
Est. average glucose Bld gHb Est-mCnc: 97 mg/dL
Hgb A1c MFr Bld: 5 % (ref 4.8–5.6)

## 2024-04-18 LAB — LIPID PANEL
Chol/HDL Ratio: 2.9 ratio (ref 0.0–5.0)
Cholesterol, Total: 152 mg/dL (ref 100–199)
HDL: 52 mg/dL (ref >39)
LDL Chol Calc (NIH): 84 mg/dL (ref 0–99)
Triglycerides: 86 mg/dL (ref 0–149)
VLDL Cholesterol Cal: 16 mg/dL (ref 5–40)

## 2024-04-18 LAB — CBC WITH DIFFERENTIAL/PLATELET
Basophils Absolute: 0.1 x10E3/uL (ref 0.0–0.2)
Basos: 1 %
EOS (ABSOLUTE): 0.2 x10E3/uL (ref 0.0–0.4)
Eos: 2 %
Hematocrit: 50.7 % (ref 37.5–51.0)
Hemoglobin: 17.2 g/dL (ref 13.0–17.7)
Immature Grans (Abs): 0 x10E3/uL (ref 0.0–0.1)
Immature Granulocytes: 0 %
Lymphocytes Absolute: 2.8 x10E3/uL (ref 0.7–3.1)
Lymphs: 36 %
MCH: 32.1 pg (ref 26.6–33.0)
MCHC: 33.9 g/dL (ref 31.5–35.7)
MCV: 95 fL (ref 79–97)
Monocytes Absolute: 0.5 x10E3/uL (ref 0.1–0.9)
Monocytes: 7 %
Neutrophils Absolute: 4.4 x10E3/uL (ref 1.4–7.0)
Neutrophils: 54 %
Platelets: 323 x10E3/uL (ref 150–450)
RBC: 5.35 x10E6/uL (ref 4.14–5.80)
RDW: 12.7 % (ref 11.6–15.4)
WBC: 8 x10E3/uL (ref 3.4–10.8)

## 2024-04-18 LAB — TSH+FREE T4
Free T4: 1.31 ng/dL (ref 0.82–1.77)
TSH: 1 u[IU]/mL (ref 0.450–4.500)

## 2024-04-18 LAB — CMP14+EGFR
ALT: 79 IU/L — ABNORMAL HIGH (ref 0–44)
AST: 39 IU/L (ref 0–40)
Albumin: 5.1 g/dL (ref 4.3–5.2)
Alkaline Phosphatase: 90 IU/L (ref 47–123)
BUN/Creatinine Ratio: 9 (ref 9–20)
BUN: 8 mg/dL (ref 6–20)
Bilirubin Total: 0.7 mg/dL (ref 0.0–1.2)
CO2: 25 mmol/L (ref 20–29)
Calcium: 10.3 mg/dL — ABNORMAL HIGH (ref 8.7–10.2)
Chloride: 101 mmol/L (ref 96–106)
Creatinine, Ser: 0.87 mg/dL (ref 0.76–1.27)
Globulin, Total: 2.8 g/dL (ref 1.5–4.5)
Glucose: 93 mg/dL (ref 70–99)
Potassium: 5 mmol/L (ref 3.5–5.2)
Sodium: 142 mmol/L (ref 134–144)
Total Protein: 7.9 g/dL (ref 6.0–8.5)
eGFR: 126 mL/min/1.73 (ref >59)

## 2024-04-18 LAB — VITAMIN D 25 HYDROXY (VIT D DEFICIENCY, FRACTURES): Vit D, 25-Hydroxy: 28.9 ng/mL — ABNORMAL LOW (ref 30.0–100.0)

## 2024-04-26 ENCOUNTER — Ambulatory Visit: Payer: Self-pay | Admitting: Family Medicine

## 2024-04-26 DIAGNOSIS — E559 Vitamin D deficiency, unspecified: Secondary | ICD-10-CM

## 2024-04-26 MED ORDER — VITAMIN D (ERGOCALCIFEROL) 1.25 MG (50000 UNIT) PO CAPS
50000.0000 [IU] | ORAL_CAPSULE | ORAL | 1 refills | Status: AC
  Filled 2024-04-26: qty 27, 189d supply, fill #0

## 2024-05-09 ENCOUNTER — Other Ambulatory Visit (HOSPITAL_COMMUNITY): Payer: Self-pay

## 2024-05-28 ENCOUNTER — Other Ambulatory Visit (HOSPITAL_COMMUNITY): Payer: Self-pay
